# Patient Record
Sex: Male | Born: 1985 | Race: Black or African American | Hispanic: No | Marital: Single | Smoking: Current every day smoker
Health system: Southern US, Community
[De-identification: ages and names within clinical notes are randomized; demographics above are authoritative.]

---

## 2011-06-19 ENCOUNTER — Emergency Department (HOSPITAL_BASED_OUTPATIENT_CLINIC_OR_DEPARTMENT_OTHER)
Admission: EM | Admit: 2011-06-19 | Discharge: 2011-06-20 | Disposition: A | Discharge status: Home / Self Care | Payer: Self-pay | Attending: Emergency Medicine | Admitting: Emergency Medicine

## 2011-06-19 ENCOUNTER — Emergency Department (INDEPENDENT_AMBULATORY_CARE_PROVIDER_SITE_OTHER): Payer: Self-pay

## 2011-06-19 ENCOUNTER — Encounter (HOSPITAL_BASED_OUTPATIENT_CLINIC_OR_DEPARTMENT_OTHER): Payer: Self-pay | Admitting: *Deleted

## 2011-06-19 DIAGNOSIS — R1013 Epigastric pain: Secondary | ICD-10-CM | POA: Insufficient documentation

## 2011-06-19 DIAGNOSIS — K219 Gastro-esophageal reflux disease without esophagitis: Secondary | ICD-10-CM | POA: Insufficient documentation

## 2011-06-19 DIAGNOSIS — R0602 Shortness of breath: Secondary | ICD-10-CM | POA: Insufficient documentation

## 2011-06-19 DIAGNOSIS — R112 Nausea with vomiting, unspecified: Secondary | ICD-10-CM | POA: Insufficient documentation

## 2011-06-19 DIAGNOSIS — R079 Chest pain, unspecified: Secondary | ICD-10-CM

## 2011-06-19 DIAGNOSIS — E119 Type 2 diabetes mellitus without complications: Secondary | ICD-10-CM

## 2011-06-19 DIAGNOSIS — F17200 Nicotine dependence, unspecified, uncomplicated: Secondary | ICD-10-CM

## 2011-06-19 DIAGNOSIS — J45901 Unspecified asthma with (acute) exacerbation: Secondary | ICD-10-CM | POA: Insufficient documentation

## 2011-06-19 MED ORDER — ALBUTEROL SULFATE HFA 108 (90 BASE) MCG/ACT IN AERS
2.0000 | INHALATION_SPRAY | RESPIRATORY_TRACT | Status: DC | PRN
  Administered 2011-06-19: 2 via RESPIRATORY_TRACT
  Filled 2011-06-19: qty 6.7

## 2011-06-19 MED ORDER — PREDNISONE 50 MG PO TABS
60.0000 mg | ORAL_TABLET | Freq: Once | ORAL | Status: AC
  Administered 2011-06-19: 60 mg via ORAL
  Filled 2011-06-19: qty 1

## 2011-06-19 MED ORDER — FAMOTIDINE 20 MG PO TABS: 20.0000 mg | ORAL_TABLET | Freq: Two times a day (BID) | ORAL | Status: DC

## 2011-06-19 MED ORDER — PREDNISONE 20 MG PO TABS: 60.0000 mg | ORAL_TABLET | Freq: Every day | ORAL | Status: AC

## 2011-06-19 NOTE — ED Provider Notes (Signed)
History     CSN: [id number]  Arrival date & time 06/19/11  2214   First MD Initiated Contact with Patient 06/19/11 2225      Chief Complaint  Patient presents with  . Chest Pain    (Consider location/radiation/quality/duration/timing/severity/associated sxs/prior treatment) HPI The patient is a 26 yo male who presents complaining of 2 months of intermittent chest pain that lasts hours and is associated with shortness of breath and rated 5/10.  The pain encompasses the entire chest.  The patient smokes both tobacco and significant amounts of marijuana.  He thinks he may have been diagnosed with asthma as a child.  Patient denies recent cough or fever as well as any sick contacts.  He does note intermittent episodes of epigastric pain as well and vomiting that can occur every few weeks in a variety of contexts and at a variety of times of day.  There are no other associated or modifying factors.  History reviewed. No pertinent past medical history.  History reviewed. No pertinent past surgical history.  History reviewed. No pertinent family history.  History  Substance Use Topics  . Smoking status: Current Everyday Smoker -- 0.5 packs/day  . Smokeless tobacco: Not on file  . Alcohol Use: Yes      Review of Systems  Constitutional: Negative.   HENT: Negative.   Eyes: Negative.   Respiratory: Positive for shortness of breath.   Cardiovascular: Positive for chest pain.  Gastrointestinal: Positive for nausea, vomiting and abdominal pain.  Genitourinary: Negative.   Musculoskeletal: Negative.   Skin: Negative.   Neurological: Negative.   Hematological: Negative.   Psychiatric/Behavioral: Negative.   All other systems reviewed and are negative.    Allergies  Review of patient's allergies indicates no known allergies.  Home Medications   Current Outpatient Rx  Name Route Sig Dispense Refill  . IBUPROFEN 200 MG PO TABS Oral Take 400 mg by mouth every 6 (six) hours as  needed. Patient used this medication for a tooth ache.    Marland Kitchen FAMOTIDINE 20 MG PO TABS Oral Take 1 tablet (20 mg total) by mouth 2 (two) times daily. 60 tablet 0  . PREDNISONE 20 MG PO TABS Oral Take 3 tablets (60 mg total) by mouth daily. 12 tablet 0    BP 150/84  Pulse 107  Temp(Src) 98.1 F (36.7 C) (Oral)  Resp 18  SpO2 98%  Physical Exam  Nursing note and vitals reviewed. GEN: Well-developed, well-nourished male in no distress HEENT: Atraumatic, normocephalic. Oropharynx clear without erythema EYES: PERRLA BL, no scleral icterus. NECK: Trachea midline, no meningismus CV: regular rate and rhythm. No murmurs, rubs, or gallops PULM: No respiratory distress.  No crackles, wheezes, or rales. Diminished throughout. GI: soft, non-tender. No guarding, rebound, or tenderness. + bowel sounds  GU: deferred Neuro: cranial nerves 2-12 intact, no abnormalities of strength or sensation, A and O x 3 MSK: Patient moves all 4 extremities symmetrically, no deformity, edema, or injury noted Skin: No rashes petechiae, purpura, or jaundice Psych: no abnormality of mood   ED Course  Procedures (including critical care time)   Date: 06/19/2011  Rate: 100  Rhythm: normal sinus rhythm  QRS Axis: right  Intervals: normal  ST/T Wave abnormalities: nonspecific T wave changes  Conduction Disutrbances:none  Narrative Interpretation:   Old EKG Reviewed: none available   Labs Reviewed - No data to display Dg Chest 2 View  06/19/2011  *RADIOLOGY REPORT*  Clinical Data: Mid chest pain for 6-8 months; history of  smoking and diabetes.  CHEST - 2 VIEW  Comparison: None.  Findings: The lungs are well-aerated and clear.  There is no evidence of focal opacification, pleural effusion or pneumothorax.  The heart is normal in size; the mediastinal contour is within normal limits.  No acute osseous abnormalities are seen.  IMPRESSION: No acute cardiopulmonary process seen.  Original Report Authenticated By:  Tonia Ghent, M.D.     1. Chest pain   2. Asthma exacerbation   3. GERD (gastroesophageal reflux disease)       MDM  The patient was evaluated for his complaints with CXR and ECG.  The patient had no signs of pneumonia.  There were no signs of acute ischemia on ECG.  The patient was given albuterol and had significant improvement on reassessment.  He was treated here with prednisone as well.  He was discharged with both.  Also, given his description of his abdominal pain and vomiting he was given a prescription for pepcid.  Patient will follow-up regarding these issues with a PCP.  Patient was also counseled on smoking cessation.  He was discharged in good condition.        Cyndra Numbers, MD 06/20/11 1344

## 2011-06-19 NOTE — ED Notes (Signed)
Dr. Hunt at bedside.

## 2011-06-19 NOTE — ED Notes (Signed)
Pt ambulatory to XR

## 2011-06-19 NOTE — ED Notes (Signed)
Chest pain on an off x 2 months. Uses a lot of marijuana per pt.

## 2011-06-19 NOTE — Discharge Instructions (Signed)
Asthma, Adult Asthma is caused by narrowing of the air passages in the lungs. It may be triggered by pollen, dust, animal dander, molds, some foods, respiratory infections, exposure to smoke, exercise, emotional stress or other allergens (things that cause allergic reactions or allergies). Repeat attacks are common. HOME CARE INSTRUCTIONS   Use prescription medications as ordered by your caregiver.   Avoid pollen, dust, animal dander, molds, smoke and other things that cause attacks at home and at work.   You may have fewer attacks if you decrease dust in your home. Electrostatic air cleaners may help.   It may help to replace your pillows or mattress with materials less likely to cause allergies.   Talk to your caregiver about an action plan for managing asthma attacks at home, including, the use of a peak flow meter which measures the severity of your asthma attack. An action plan can help minimize or stop the attack without having to seek medical care.   If you are not on a fluid restriction, drink 8 to 10 glasses of water each day.   Always have a plan prepared for seeking medical attention, including, calling your physician, accessing local emergency care, and calling 911 (in the [place] for a severe attack.   Discuss possible exercise routines with your caregiver.   If animal dander is the cause of asthma, you may need to get rid of pets.  SEEK MEDICAL CARE IF:   You have wheezing and shortness of breath even if taking medicine to prevent attacks.   You have muscle aches, chest pain or thickening of sputum.   Your sputum changes from clear or white to yellow, green, gray, or bloody.   You have any problems that may be related to the medicine you are taking (such as a rash, itching, swelling or trouble breathing).  SEEK IMMEDIATE MEDICAL CARE IF:   Your usual medicines do not stop your wheezing or there is increased coughing and/or shortness of breath.   You have increased  difficulty breathing.   You have a fever.  MAKE SURE YOU:   Understand these instructions.   Will watch your condition.   Will get help right away if you are not doing well or get worse.  Document Released: 02/24/2005 Document Revised: 02/13/2011 Document Reviewed: 10/13/2007 [facility] Patient Information 2012 [place], [place].Chest Pain (Nonspecific) It is often hard to give a specific diagnosis for the cause of chest pain. There is always a chance that your pain could be related to something serious, such as a heart attack or a blood clot in the lungs. You need to follow up with your caregiver for further evaluation. CAUSES   Heartburn.   Pneumonia or bronchitis.   Anxiety or stress.   Inflammation around your heart (pericarditis) or lung (pleuritis or pleurisy).   A blood clot in the lung.   A collapsed lung (pneumothorax). It can develop suddenly on its own (spontaneous pneumothorax) or from injury (trauma) to the chest.   Shingles infection (herpes zoster virus).  The chest wall is composed of bones, muscles, and cartilage. Any of these can be the source of the pain.  The bones can be bruised by injury.   The muscles or cartilage can be strained by coughing or overwork.   The cartilage can be affected by inflammation and become sore (costochondritis).  DIAGNOSIS  Lab tests or other studies, such as X-rays, electrocardiography, stress testing, or cardiac imaging, may be needed to find the cause of your pain.  TREATMENT   Treatment depends on what may be causing your chest pain. Treatment may include:   Acid blockers for heartburn.   Anti-inflammatory medicine.   Pain medicine for inflammatory conditions.   Antibiotics if an infection is present.   You may be advised to change lifestyle habits. This includes stopping smoking and avoiding alcohol, caffeine, and chocolate.   You may be advised to keep your head raised (elevated) when sleeping. This reduces the chance  of acid going backward from your stomach into your esophagus.   Most of the time, nonspecific chest pain will improve within 2 to 3 days with rest and mild pain medicine.  HOME CARE INSTRUCTIONS   If antibiotics were prescribed, take your antibiotics as directed. Finish them even if you start to feel better.   For the next few days, avoid physical activities that bring on chest pain. Continue physical activities as directed.   Do not smoke.   Avoid drinking alcohol.   Only take over-the-counter or prescription medicine for pain, discomfort, or fever as directed by your caregiver.   Follow your caregiver's suggestions for further testing if your chest pain does not go away.   Keep any follow-up appointments you made. If you do not go to an appointment, you could develop lasting (chronic) problems with pain. If there is any problem keeping an appointment, you must call to reschedule.  SEEK MEDICAL CARE IF:   You think you are having problems from the medicine you are taking. Read your medicine instructions carefully.   Your chest pain does not go away, even after treatment.   You develop a rash with blisters on your chest.  SEEK IMMEDIATE MEDICAL CARE IF:   You have increased chest pain or pain that spreads to your arm, neck, jaw, back, or abdomen.   You develop shortness of breath, an increasing cough, or you are coughing up blood.   You have severe back or abdominal pain, feel nauseous, or vomit.   You develop severe weakness, fainting, or chills.   You have a fever.  THIS IS AN EMERGENCY. Do not wait to see if the pain will go away. Get medical help at once. Call your local emergency services (911 in [place] Do not drive yourself to the hospital. MAKE SURE YOU:   Understand these instructions.   Will watch your condition.   Will get help right away if you are not doing well or get worse.  Document Released: 12/04/2004 Document Revised: 02/13/2011 Document Reviewed:  09/30/2007 [facility] Patient Information 2012 [place], [place].Gastroesophageal Reflux Disease, Adult Gastroesophageal reflux disease (GERD) happens when acid from your stomach flows up into the esophagus. When acid comes in contact with the esophagus, the acid causes soreness (inflammation) in the esophagus. Over time, GERD may create small holes (ulcers) in the lining of the esophagus. CAUSES   Increased body weight. This puts pressure on the stomach, making acid rise from the stomach into the esophagus.   Smoking. This increases acid production in the stomach.   Drinking alcohol. This causes decreased pressure in the lower esophageal sphincter (valve or ring of muscle between the esophagus and stomach), allowing acid from the stomach into the esophagus.   Late evening meals and a full stomach. This increases pressure and acid production in the stomach.   A malformed lower esophageal sphincter.  Sometimes, no cause is found. SYMPTOMS   Burning pain in the lower part of the mid-chest behind the breastbone and in the mid-stomach area. This may  occur twice a week or more often.   Trouble swallowing.   Sore throat.   Dry cough.   Asthma-like symptoms including chest tightness, shortness of breath, or wheezing.  DIAGNOSIS  Your caregiver may be able to diagnose GERD based on your symptoms. In some cases, X-rays and other tests may be done to check for complications or to check the condition of your stomach and esophagus. TREATMENT  Your caregiver may recommend over-the-counter or prescription medicines to help decrease acid production. Ask your caregiver before starting or adding any new medicines.  HOME CARE INSTRUCTIONS   Change the factors that you can control. Ask your caregiver for guidance concerning weight loss, quitting smoking, and alcohol consumption.   Avoid foods and drinks that make your symptoms worse, such as:   Caffeine or alcoholic drinks.   Chocolate.   Peppermint  or mint flavorings.   Garlic and onions.   Spicy foods.   Citrus fruits, such as oranges, lemons, or limes.   Tomato-based foods such as sauce, chili, salsa, and pizza.   Fried and fatty foods.   Avoid lying down for the 3 hours prior to your bedtime or prior to taking a nap.   Eat small, frequent meals instead of large meals.   Wear loose-fitting clothing. Do not wear anything tight around your waist that causes pressure on your stomach.   Raise the head of your bed 6 to 8 inches with wood blocks to help you sleep. Extra pillows will not help.   Only take over-the-counter or prescription medicines for pain, discomfort, or fever as directed by your caregiver.   Do not take aspirin, ibuprofen, or other nonsteroidal anti-inflammatory drugs (NSAIDs).  SEEK IMMEDIATE MEDICAL CARE IF:   You have pain in your arms, neck, jaw, teeth, or back.   Your pain increases or changes in intensity or duration.   You develop nausea, vomiting, or sweating (diaphoresis).   You develop shortness of breath, or you faint.   Your vomit is green, yellow, black, or looks like coffee grounds or blood.   Your stool is red, bloody, or black.  These symptoms could be signs of other problems, such as heart disease, gastric bleeding, or esophageal bleeding. MAKE SURE YOU:   Understand these instructions.   Will watch your condition.   Will get help right away if you are not doing well or get worse.  Document Released: 12/04/2004 Document Revised: 02/13/2011 Document Reviewed: 09/13/2010 [facility] Patient Information 2012 [place], [place].

## 2011-06-19 NOTE — ED Notes (Signed)
Pt reports intermittent anterior left sided chest pains x2 months. Episodes will last approx 5-76min, then subside. Worsens with deep breathing. Mild SOB with the episodes. Pt admits to daily marijuana, but denies crack cocaine or other drugs. Denies CP at this time. States he was encouraged to come to ER d/t continuing pain.

## 2011-07-24 ENCOUNTER — Encounter (HOSPITAL_BASED_OUTPATIENT_CLINIC_OR_DEPARTMENT_OTHER): Payer: Self-pay

## 2011-07-24 ENCOUNTER — Emergency Department (HOSPITAL_BASED_OUTPATIENT_CLINIC_OR_DEPARTMENT_OTHER)
Admission: EM | Admit: 2011-07-24 | Discharge: 2011-07-24 | Disposition: A | Discharge status: Home / Self Care | Payer: Self-pay | Attending: Emergency Medicine | Admitting: Emergency Medicine

## 2011-07-24 DIAGNOSIS — J029 Acute pharyngitis, unspecified: Secondary | ICD-10-CM | POA: Insufficient documentation

## 2011-07-24 DIAGNOSIS — K0381 Cracked tooth: Secondary | ICD-10-CM | POA: Insufficient documentation

## 2011-07-24 DIAGNOSIS — J45909 Unspecified asthma, uncomplicated: Secondary | ICD-10-CM | POA: Insufficient documentation

## 2011-07-24 DIAGNOSIS — K089 Disorder of teeth and supporting structures, unspecified: Secondary | ICD-10-CM | POA: Insufficient documentation

## 2011-07-24 DIAGNOSIS — F17200 Nicotine dependence, unspecified, uncomplicated: Secondary | ICD-10-CM | POA: Insufficient documentation

## 2011-07-24 LAB — RAPID STREP SCREEN (MED CTR MEBANE ONLY): Streptococcus, Group A Screen (Direct): NEGATIVE

## 2011-07-24 MED ORDER — NAPROXEN 500 MG PO TABS: 500.0000 mg | ORAL_TABLET | Freq: Two times a day (BID) | ORAL | Status: AC

## 2011-07-24 NOTE — Discharge Instructions (Signed)
Pharyngitis, Viral and Bacterial Pharyngitis is soreness (inflammation) or infection of the pharynx. It is also called a sore throat. CAUSES  Most sore throats are caused by viruses and are part of a cold. However, some sore throats are caused by strep and other bacteria. Sore throats can also be caused by post nasal drip from draining sinuses, allergies and sometimes from sleeping with an open mouth. Infectious sore throats can be spread from person to person by coughing, sneezing and sharing cups or eating utensils. TREATMENT  Sore throats that are viral usually last 3-4 days. Viral illness will get better without medications (antibiotics). Strep throat and other bacterial infections will usually begin to get better about 24-48 hours after you begin to take antibiotics. HOME CARE INSTRUCTIONS   If the caregiver feels there is a bacterial infection or if there is a positive strep test, they will prescribe an antibiotic. The full course of antibiotics must be taken. If the full course of antibiotic is not taken, you or your child may become ill again. If you or your child has strep throat and do not finish all of the medication, serious heart or kidney diseases may develop.   Drink enough water and fluids to keep your urine clear or pale yellow.   Only take over-the-counter or prescription medicines for pain, discomfort or fever as directed by your caregiver.   Get lots of rest.   Gargle with salt water ( tsp. of salt in a glass of water) as often as every 1-2 hours as you need for comfort.   Hard candies may soothe the throat if individual is not at risk for choking. Throat sprays or lozenges may also be used.  SEEK MEDICAL CARE IF:   Large, tender lumps in the neck develop.   A rash develops.   Green, yellow-brown or bloody sputum is coughed up.   Your baby is older than 3 months with a rectal temperature of 100.5 F (38.1 C) or higher for more than 1 day.  SEEK IMMEDIATE MEDICAL CARE  IF:   A stiff neck develops.   You or your child are drooling or unable to swallow liquids.   You or your child are vomiting, unable to keep medications or liquids down.   You or your child has severe pain, unrelieved with recommended medications.   You or your child are having difficulty breathing (not due to stuffy nose).   You or your child are unable to fully open your mouth.   You or your child develop redness, swelling, or severe pain anywhere on the neck.   You have a fever.   Your baby is older than 3 months with a rectal temperature of 102 F (38.9 C) or higher.   Your baby is 3 months old or younger with a rectal temperature of 100.4 F (38 C) or higher.  MAKE SURE YOU:   Understand these instructions.   Will watch your condition.   Will get help right away if you are not doing well or get worse.  Document Released: 02/24/2005 Document Revised: 02/13/2011 Document Reviewed: 05/24/2007 [employer] Patient Information 2012 [employer].   RESOURCE GUIDE  Dental Problems  Patients with Medicaid:  [facility]                     [address]                                             Phone:  [phone]                                                  If unable to pay or uninsured, contact:  [facility] or [facility] to become qualified for the adult dental clinic.  Chronic Pain Problems Contact [facility]  [phone] Patients need to be referred by their primary care doctor.  Insufficient Money for Medicine Contact [employer]:  call "211" or [facility] 271-5999.  No Primary Care Doctor Call [facility]  832-8000 Other agencies that provide inexpensive medical care    [facility]  832-8035     Internal Medicine  [phone]    [facility]  [phone]    Women[facility] Clinic  [phone]    [facility]  [phone]    [facility]   [phone]  Substance Abuse Resources [facility]  336-882-2125 [facility] 336-784-9470 [facility] 336-285-9073 [facility] 336-845-3988 Residential & Outpatient Substance Abuse Program  800-659-3381  Psychological Services [facility]  832-9600 [facility]  [phone] [facility]   800 853-5163 (emergency services 641-4993)  Abuse/Neglect [place] Child Abuse Hotline (336) 641-3795 [place] [facility] Abuse Hotline 800-378-5315 (After Hours)  Emergency Shelter [facility] (336) 271-5985  [facility] (336) 275-9566 [facility] (704) 372-4663  MRSA Hotline #:   832-7006    [place] Resources  [facility] [place]  [employer]                           [facility] [address] [place]                     [address]         [address]  [place]                                               [place]                              [place] Phone:  [phone]                                  Phone:  [phone]                   Phone:  [phone]  [facility] Phone:  [phone]  [place] Child Abuse Hotline (336) 342-1394 (336) 342-3537 (After Hours) 

## 2011-07-24 NOTE — ED Provider Notes (Signed)
History     CSN: [id number]  Arrival date & time 07/24/11  2016   First MD Initiated Contact with Patient 07/24/11 2106      Chief Complaint  Patient presents with  . Dental Pain  . Sore Throat    (Consider location/radiation/quality/duration/timing/severity/associated sxs/prior treatment) HPI Comments: Patient presents with 2 complaints today.  His first complaint is a broken left lower molar for the last one to 2 years.  He states the pain has been increasing over the last few days.  He has no fevers and no jaw swelling.  He secondarily noted that he's had a sore throat since this morning.  It hurts to swallow.  He has not tried any medications for it.  He does not have a dentist.  Patient is a 26 y.o. male presenting with tooth pain and pharyngitis. The history is provided by the patient. No language interpreter was used.  Dental PainThe primary symptoms include mouth pain. Primary symptoms do not include headaches, fever, shortness of breath or cough.   Sore Throat Pertinent negatives include no chest pain, no abdominal pain, no headaches and no shortness of breath.    Past Medical History  Diagnosis Date  . Asthma     History reviewed. No pertinent past surgical history.  No family history on file.  History  Substance Use Topics  . Smoking status: Current Everyday Smoker -- 0.5 packs/day  . Smokeless tobacco: Not on file  . Alcohol Use: Yes      Review of Systems  Constitutional: Negative.  Negative for fever and chills.  Eyes: Negative.  Negative for discharge and redness.  Respiratory: Negative.  Negative for cough and shortness of breath.   Cardiovascular: Negative.  Negative for chest pain.  Gastrointestinal: Negative.  Negative for nausea, vomiting and abdominal pain.  Genitourinary: Negative.  Negative for hematuria.  Musculoskeletal: Negative.  Negative for back pain.  Skin: Negative.  Negative for color change and rash.  Neurological: Negative for  syncope and headaches.  Hematological: Negative.  Negative for adenopathy.  Psychiatric/Behavioral: Negative.  Negative for confusion.  All other systems reviewed and are negative.    Allergies  Review of patient's allergies indicates no known allergies.  Home Medications   Current Outpatient Rx  Name Route Sig Dispense Refill  . IBUPROFEN 200 MG PO TABS Oral Take 400 mg by mouth every 6 (six) hours as needed. Patient used this medication for a tooth ache.      BP 167/93  Pulse 77  Temp(Src) 98.8 F (37.1 C) (Oral)  Resp 18  Ht 6' (1.829 m)  Wt 180 lb (81.647 kg)  BMI 24.41 kg/m2  SpO2 98%  Physical Exam  Nursing note and vitals reviewed. Constitutional: He is oriented to person, place, and time. He appears well-developed and well-nourished.  Non-toxic appearance. He does not have a sickly appearance.  HENT:  Head: Normocephalic and atraumatic.  Mouth/Throat:         Bilateral tonsillar swelling, erythema and exudates on the left tonsil.  Uvula is midline.  Eyes: Conjunctivae, EOM and lids are normal. Pupils are equal, round, and reactive to light.  Neck: Trachea normal, normal range of motion and full passive range of motion without pain. Neck supple.  Cardiovascular: Normal rate, regular rhythm and normal heart sounds.   Pulmonary/Chest: Effort normal and breath sounds normal. No respiratory distress.  Abdominal: Soft. Normal appearance. He exhibits no distension. There is no tenderness. There is no rebound and no CVA tenderness.  Musculoskeletal: Normal range of motion.  Lymphadenopathy:    He has no cervical adenopathy.  Neurological: He is alert and oriented to person, place, and time. He has normal strength.  Skin: Skin is warm, dry and intact. No rash noted.  Psychiatric: He has a normal mood and affect. His behavior is normal. Judgment and thought content normal.    ED Course  Procedures (including critical care time)  Results for orders placed during the  hospital encounter of 07/24/11  RAPID STREP SCREEN      Component Value Range   Streptococcus, Group A Screen (Direct) NEGATIVE  NEGATIVE        MDM  Patient with likely viral pharyngitis given his exam and a negative strep test at this time.  Regarding his tooth is a chronic issue and I will refer him to our dental clinic resources.        Nat Christen, MD 07/24/11 [phone]

## 2011-07-24 NOTE — ED Notes (Signed)
Left lower tooth ache x 1-2 years-sore throat started today

## 2012-04-02 ENCOUNTER — Encounter (HOSPITAL_BASED_OUTPATIENT_CLINIC_OR_DEPARTMENT_OTHER): Payer: Self-pay

## 2012-04-02 ENCOUNTER — Emergency Department (HOSPITAL_BASED_OUTPATIENT_CLINIC_OR_DEPARTMENT_OTHER)
Admission: EM | Admit: 2012-04-02 | Discharge: 2012-04-02 | Disposition: A | Discharge status: Home / Self Care | Payer: Self-pay | Attending: Emergency Medicine | Admitting: Emergency Medicine

## 2012-04-02 DIAGNOSIS — F17200 Nicotine dependence, unspecified, uncomplicated: Secondary | ICD-10-CM | POA: Insufficient documentation

## 2012-04-02 DIAGNOSIS — J45909 Unspecified asthma, uncomplicated: Secondary | ICD-10-CM | POA: Insufficient documentation

## 2012-04-02 DIAGNOSIS — R300 Dysuria: Secondary | ICD-10-CM | POA: Insufficient documentation

## 2012-04-02 LAB — URINALYSIS, ROUTINE W REFLEX MICROSCOPIC
Glucose, UA: NEGATIVE mg/dL
Ketones, ur: NEGATIVE mg/dL
Leukocytes, UA: NEGATIVE
Nitrite: NEGATIVE
pH: 6 (ref 5.0–8.0)

## 2012-04-02 MED ORDER — CEFTRIAXONE SODIUM 250 MG IJ SOLR
250.0000 mg | INTRAMUSCULAR | Status: DC
  Administered 2012-04-02: 250 mg via INTRAMUSCULAR
  Filled 2012-04-02: qty 250

## 2012-04-02 MED ORDER — AZITHROMYCIN 250 MG PO TABS
1000.0000 mg | ORAL_TABLET | Freq: Once | ORAL | Status: AC
Start: 2012-04-02 — End: 2012-04-02
  Administered 2012-04-02: 1000 mg via ORAL
  Filled 2012-04-02: qty 4

## 2012-04-02 NOTE — ED Provider Notes (Signed)
History     CSN: [id number]  Arrival date & time 04/02/12  1304   First MD Initiated Contact with Patient 04/02/12 1327      Chief Complaint  Patient presents with  . Dysuria    HPI Patient has had mild discomfort with urination for the last 2 days. He has the sense that he needs to urinate but then does not urinate a significant amount. He has not had any discharge. He denies any fever or abdominal pain or other complaints. His girlfriend is here as well for treatment for urinary symptoms. Past Medical History  Diagnosis Date  . Asthma     History reviewed. No pertinent past surgical history.  No family history on file.  History  Substance Use Topics  . Smoking status: Current Every Day Smoker -- 0.5 packs/day  . Smokeless tobacco: Not on file  . Alcohol Use: Yes      Review of Systems  Constitutional: Negative for fever.  Gastrointestinal: Negative for abdominal pain.  Genitourinary: Negative for discharge, scrotal swelling and genital sores.    Allergies  Review of patient's allergies indicates no known allergies.  Home Medications   Current Outpatient Rx  Name  Route  Sig  Dispense  Refill  . IBUPROFEN 200 MG PO TABS   Oral   Take 400 mg by mouth every 6 (six) hours as needed. Patient used this medication for a tooth ache.         Marland Kitchen NAPROXEN 500 MG PO TABS   Oral   Take 1 tablet (500 mg total) by mouth 2 (two) times daily.   30 tablet   0     BP 166/88  Pulse 94  Temp 98.1 F (36.7 C) (Oral)  Resp 14  Ht 6' (1.829 m)  Wt 180 lb (81.647 kg)  BMI 24.41 kg/m2  SpO2 100%  Physical Exam  Nursing note and vitals reviewed. Constitutional: He appears well-developed and well-nourished. No distress.  HENT:  Head: Normocephalic and atraumatic.  Right Ear: External ear normal.  Left Ear: External ear normal.  Eyes: Conjunctivae normal are normal. Right eye exhibits no discharge. Left eye exhibits no discharge. No scleral icterus.  Neck: Neck  supple. No tracheal deviation present.  Cardiovascular: Normal rate.   Pulmonary/Chest: Effort normal. No stridor. No respiratory distress.  Genitourinary: Penis normal. No penile tenderness.       No penile discharge  Musculoskeletal: He exhibits no edema.  Neurological: He is alert. Cranial nerve deficit: no gross deficits.  Skin: Skin is warm and dry. No rash noted.  Psychiatric: He has a normal mood and affect.    ED Course  Procedures (including critical care time)   Labs Reviewed  URINALYSIS, ROUTINE W REFLEX MICROSCOPIC  GC/CHLAMYDIA PROBE AMP   No results found.   1. Dysuria       MDM  Patient was tested for chlamydia and gonorrhea. He opted for empiric treatment for possible STD.        Celene Kras, MD 04/02/12 [phone]

## 2012-04-02 NOTE — ED Notes (Signed)
C/o dysuria x 2 days-denies penile d/c-concerned that girl friend has same s/s

## 2012-04-03 LAB — GC/CHLAMYDIA PROBE AMP: CT Probe RNA: NEGATIVE

## 2013-05-01 ENCOUNTER — Emergency Department (HOSPITAL_BASED_OUTPATIENT_CLINIC_OR_DEPARTMENT_OTHER)
Admission: EM | Admit: 2013-05-01 | Discharge: 2013-05-01 | Disposition: A | Discharge status: Home / Self Care | Payer: Self-pay | Attending: Emergency Medicine | Admitting: Emergency Medicine

## 2013-05-01 ENCOUNTER — Encounter (HOSPITAL_BASED_OUTPATIENT_CLINIC_OR_DEPARTMENT_OTHER): Payer: Self-pay | Admitting: Emergency Medicine

## 2013-05-01 DIAGNOSIS — F17200 Nicotine dependence, unspecified, uncomplicated: Secondary | ICD-10-CM | POA: Insufficient documentation

## 2013-05-01 DIAGNOSIS — K089 Disorder of teeth and supporting structures, unspecified: Secondary | ICD-10-CM | POA: Insufficient documentation

## 2013-05-01 DIAGNOSIS — K0889 Other specified disorders of teeth and supporting structures: Secondary | ICD-10-CM

## 2013-05-01 DIAGNOSIS — J45909 Unspecified asthma, uncomplicated: Secondary | ICD-10-CM | POA: Insufficient documentation

## 2013-05-01 MED ORDER — AMOXICILLIN 500 MG PO CAPS: 500.0000 mg | ORAL_CAPSULE | Freq: Three times a day (TID) | ORAL | Status: AC

## 2013-05-01 MED ORDER — IBUPROFEN 800 MG PO TABS: 800.0000 mg | ORAL_TABLET | Freq: Three times a day (TID) | ORAL | Status: AC

## 2013-05-01 NOTE — ED Provider Notes (Signed)
CSN: [id number]     Arrival date & time 05/01/13  1722 History   First MD Initiated Contact with Patient 05/01/13 1821     Chief Complaint  Patient presents with  . Dental Pain     (Consider location/radiation/quality/duration/timing/severity/associated sxs/prior Treatment) HPI Comments: Patient here with dental pain to left upper 2nd molar - states that he knows he has a whole in the tooth where a previous filling was.  He states no fever, chills, reports some mild swelling to the area but is able to open mouth, swallow and denies sore throat.  The history is provided by the patient. No language interpreter was used.    Past Medical History  Diagnosis Date  . Asthma    History reviewed. No pertinent past surgical history. No family history on file. History  Substance Use Topics  . Smoking status: Current Every Day Smoker -- 0.50 packs/day  . Smokeless tobacco: Not on file  . Alcohol Use: Yes    Review of Systems  All other systems reviewed and are negative.      Allergies  Review of patient's allergies indicates no known allergies.  Home Medications   Current Outpatient Rx  Name  Route  Sig  Dispense  Refill  . amoxicillin (AMOXIL) 500 MG capsule   Oral   Take 1 capsule (500 mg total) by mouth 3 (three) times daily.   21 capsule   0   . ibuprofen (ADVIL,MOTRIN) 200 MG tablet   Oral   Take 400 mg by mouth every 6 (six) hours as needed. Patient used this medication for a tooth ache.         . ibuprofen (ADVIL,MOTRIN) 800 MG tablet   Oral   Take 1 tablet (800 mg total) by mouth 3 (three) times daily.   21 tablet   0    BP 162/94  Pulse 92  Temp(Src) 98.7 F (37.1 C) (Oral)  Resp 18  SpO2 100% Physical Exam  Nursing note and vitals reviewed. Constitutional: He is oriented to person, place, and time. He appears well-developed and well-nourished. No distress.  HENT:  Head: Normocephalic and atraumatic.  Right Ear: External ear normal.  Left Ear:  External ear normal.  Nose: Nose normal.  Mouth/Throat: Oropharynx is clear and moist. No trismus in the jaw. No dental abscesses or dental caries. No oropharyngeal exudate.    Eyes: Conjunctivae are normal. Pupils are equal, round, and reactive to light. No scleral icterus.  Neck: Normal range of motion.  Pulmonary/Chest: Effort normal.  Musculoskeletal: Normal range of motion. He exhibits no edema and no tenderness.  Lymphadenopathy:    He has no cervical adenopathy.  Neurological: He is alert and oriented to person, place, and time. He exhibits normal muscle tone. Coordination normal.  Skin: Skin is warm and dry. No rash noted. No erythema. No pallor.  Psychiatric: He has a normal mood and affect. His behavior is normal. Judgment and thought content normal.    ED Course  Procedures (including critical care time) Labs Review Labs Reviewed - No data to display Imaging Review No results found.  EKG Interpretation   None       MDM   Final diagnoses:  Pain, dental    Patient here with dental pain - possible abscess early, will start on ibuprofen and antibiotics for this.    Izola PriceFrances C. Marisue HumbleSanford, PA-C 05/02/13 0006

## 2013-05-01 NOTE — Discharge Instructions (Signed)
Dental Care and Dentist Visits °Dental care supports good overall health. Regular dental visits can also help you avoid dental pain, bleeding, infection, and other more serious health problems in the future. It is important to keep the mouth healthy because diseases in the teeth, gums, and other oral tissues can spread to other areas of the body. Some problems, such as diabetes, heart disease, and pre-term labor have been associated with poor oral health.  °See your dentist every 6 months. If you experience emergency problems such as a toothache or broken tooth, go to the dentist right away. If you see your dentist regularly, you may catch problems early. It is easier to be treated for problems in the early stages.  °WHAT TO EXPECT AT A DENTIST VISIT  °Your dentist will look for many common oral health problems and recommend proper treatment. At your regular dental visit, you can expect: °· Gentle cleaning of the teeth and gums. This includes scraping and polishing. This helps to remove the sticky substance around the teeth and gums (plaque). Plaque forms in the mouth shortly after eating. Over time, plaque hardens on the teeth as tartar. If tartar is not removed regularly, it can cause problems. Cleaning also helps remove stains. °· Periodic X-rays. These pictures of the teeth and supporting bone will help your dentist assess the health of your teeth. °· Periodic fluoride treatments. Fluoride is a natural mineral shown to help strengthen teeth. Fluoride treatment involves applying a fluoride gel or varnish to the teeth. It is most commonly done in children. °· Examination of the mouth, tongue, jaws, teeth, and gums to look for any oral health problems, such as: °· Cavities (dental caries). This is decay on the tooth caused by plaque, sugar, and acid in the mouth. It is best to catch a cavity when it is small. °· Inflammation of the gums caused by plaque buildup (gingivitis). °· Problems with the mouth or malformed  or misaligned teeth. °· Oral cancer or other diseases of the soft tissues or jaws.  °KEEP YOUR TEETH AND GUMS HEALTHY °For healthy teeth and gums, follow these general guidelines as well as your dentist's specific advice: °· Have your teeth professionally cleaned at the dentist every 6 months. °· Brush twice daily with a fluoride toothpaste. °· Floss your teeth daily.  °· Ask your dentist if you need fluoride supplements, treatments, or fluoride toothpaste. °· Eat a healthy diet. Reduce foods and drinks with added sugar. °· Avoid smoking. °TREATMENT FOR ORAL HEALTH PROBLEMS °If you have oral health problems, treatment varies depending on the conditions present in your teeth and gums. °· Your caregiver will most likely recommend good oral hygiene at each visit. °· For cavities, gingivitis, or other oral health disease, your caregiver will perform a procedure to treat the problem. This is typically done at a separate appointment. Sometimes your caregiver will refer you to another dental specialist for specific tooth problems or for surgery. °SEEK IMMEDIATE DENTAL CARE IF: °· You have pain, bleeding, or soreness in the gum, tooth, jaw, or mouth area. °· A permanent tooth becomes loose or separated from the gum socket. °· You experience a blow or injury to the mouth or jaw area. °Document Released: 11/06/2010 Document Revised: 05/19/2011 Document Reviewed: 11/06/2010 °ExitCare® Patient Information ©2014 [employer]. ° °Dental Pain °A tooth ache may be caused by cavities (tooth decay). Cavities expose the nerve of the tooth to air and hot or cold temperatures. It may come from an infection or abscess (also called a   boil or furuncle) around your tooth. It is also often caused by dental caries (tooth decay). This causes the pain you are having. °DIAGNOSIS  °Your caregiver can diagnose this problem by exam. °TREATMENT  °· If caused by an infection, it may be treated with medications which kill germs (antibiotics) and pain  medications as prescribed by your caregiver. Take medications as directed. °· Only take over-the-counter or prescription medicines for pain, discomfort, or fever as directed by your caregiver. °· Whether the tooth ache today is caused by infection or dental disease, you should see your dentist as soon as possible for further care. °SEEK MEDICAL CARE IF: °The exam and treatment you received today has been provided on an emergency basis only. This is not a substitute for complete medical or dental care. If your problem worsens or new problems (symptoms) appear, and you are unable to meet with your dentist, call or return to this location. °SEEK IMMEDIATE MEDICAL CARE IF:  °· You have a fever. °· You develop redness and swelling of your face, jaw, or neck. °· You are unable to open your mouth. °· You have severe pain uncontrolled by pain medicine. °MAKE SURE YOU:  °· Understand these instructions. °· Will watch your condition. °· Will get help right away if you are not doing well or get worse. °Document Released: 02/24/2005 Document Revised: 05/19/2011 Document Reviewed: 10/13/2007 °ExitCare® Patient Information ©2014 [employer]. ° °

## 2013-05-01 NOTE — ED Notes (Signed)
Patient having dental pain left upper side for the past 2-3 weeks.

## 2013-05-06 NOTE — ED Provider Notes (Signed)
History/physical exam/procedure(s) were performed by non-physician practitioner and as supervising physician I was immediately available for consultation/collaboration. I have reviewed all notes and am in agreement with care and plan.   Hilario Quarryanielle S Raheen Capili, MD 05/06/13 [phone]

## 2013-05-30 IMAGING — CR DG CHEST 2V
2 series · 2 of 2 positions shown · non-contrast
Comparison: None.

CLINICAL DATA: Mid chest pain for 6-8 months; history of smoking
and diabetes.

CHEST - 2 VIEW

[w chest pa]
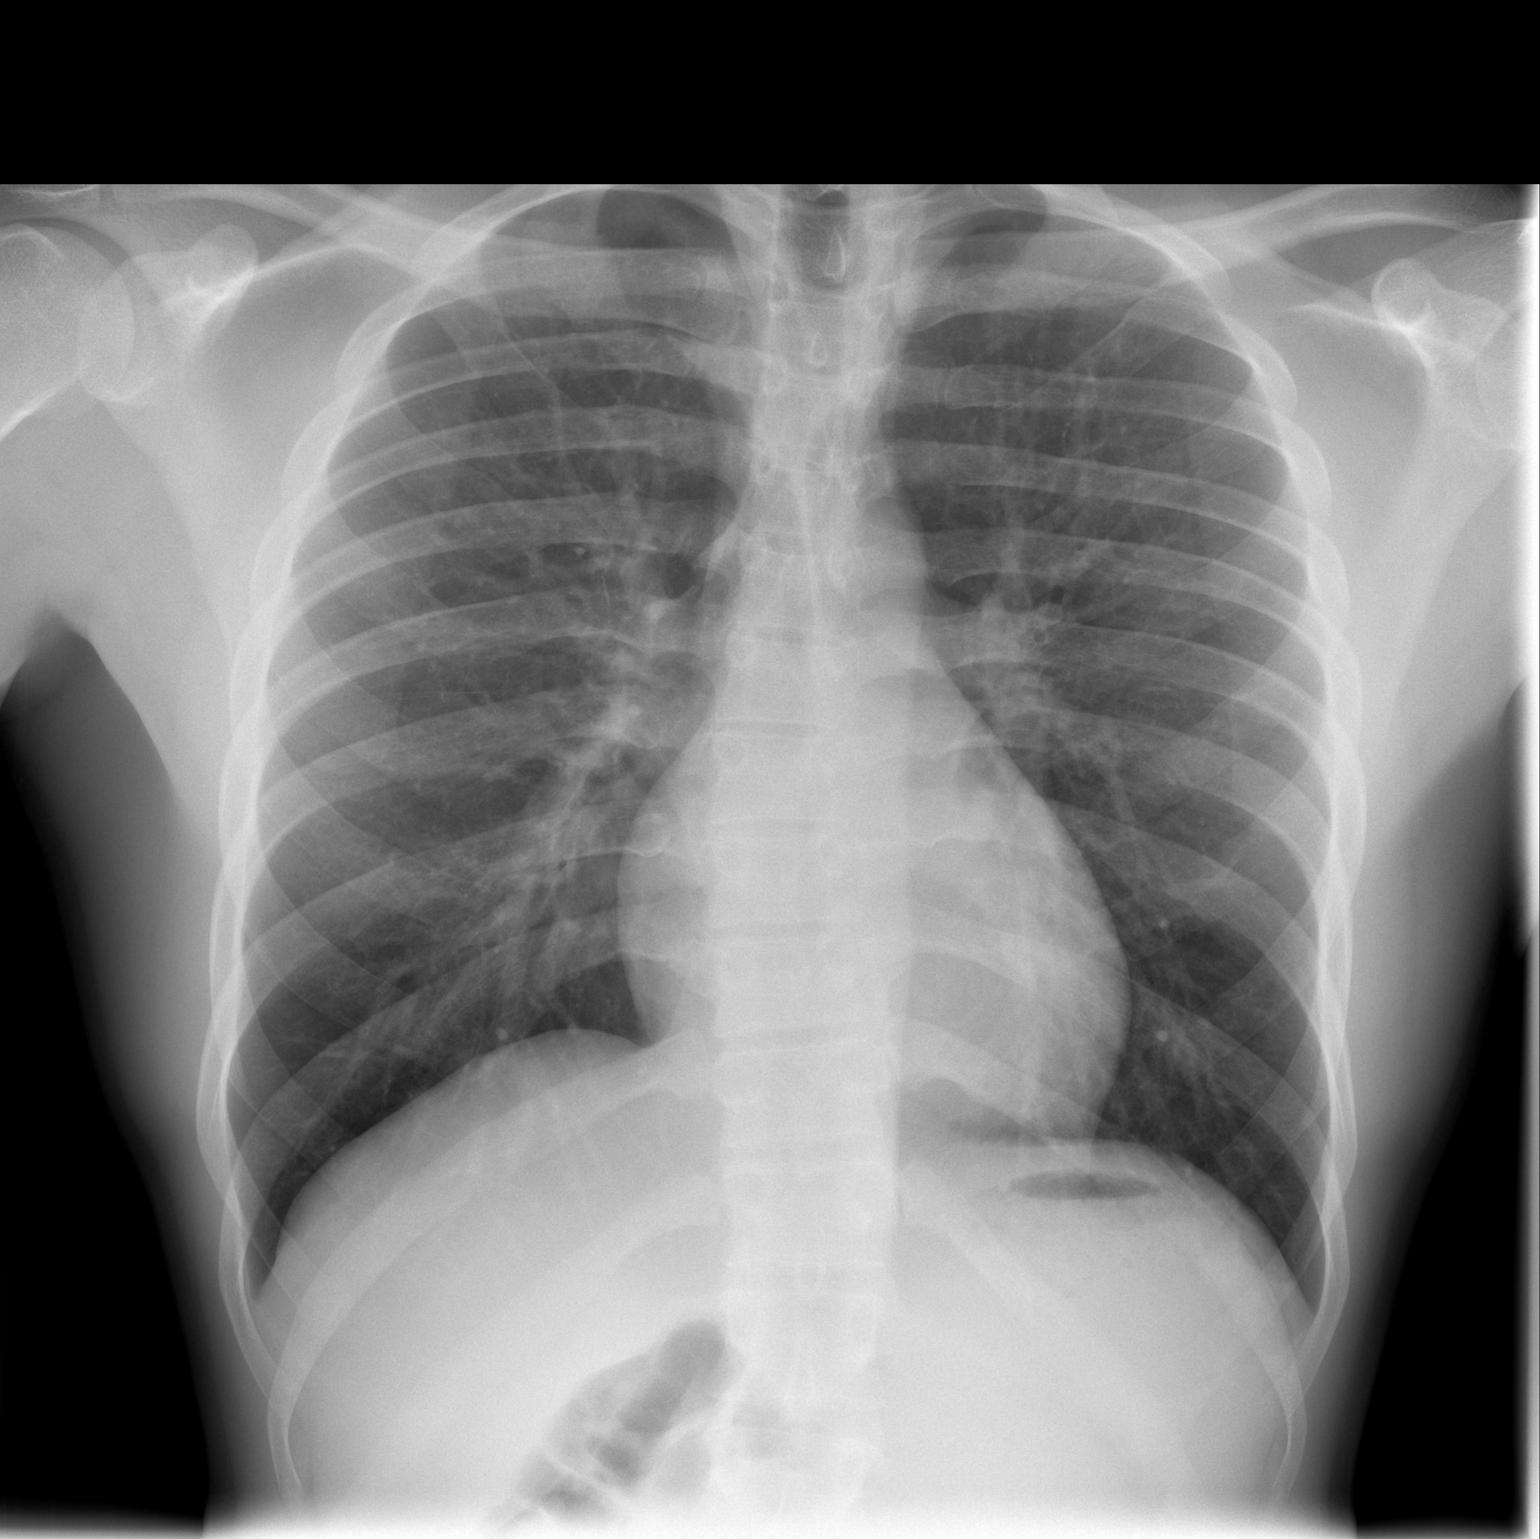

[w chest lat]
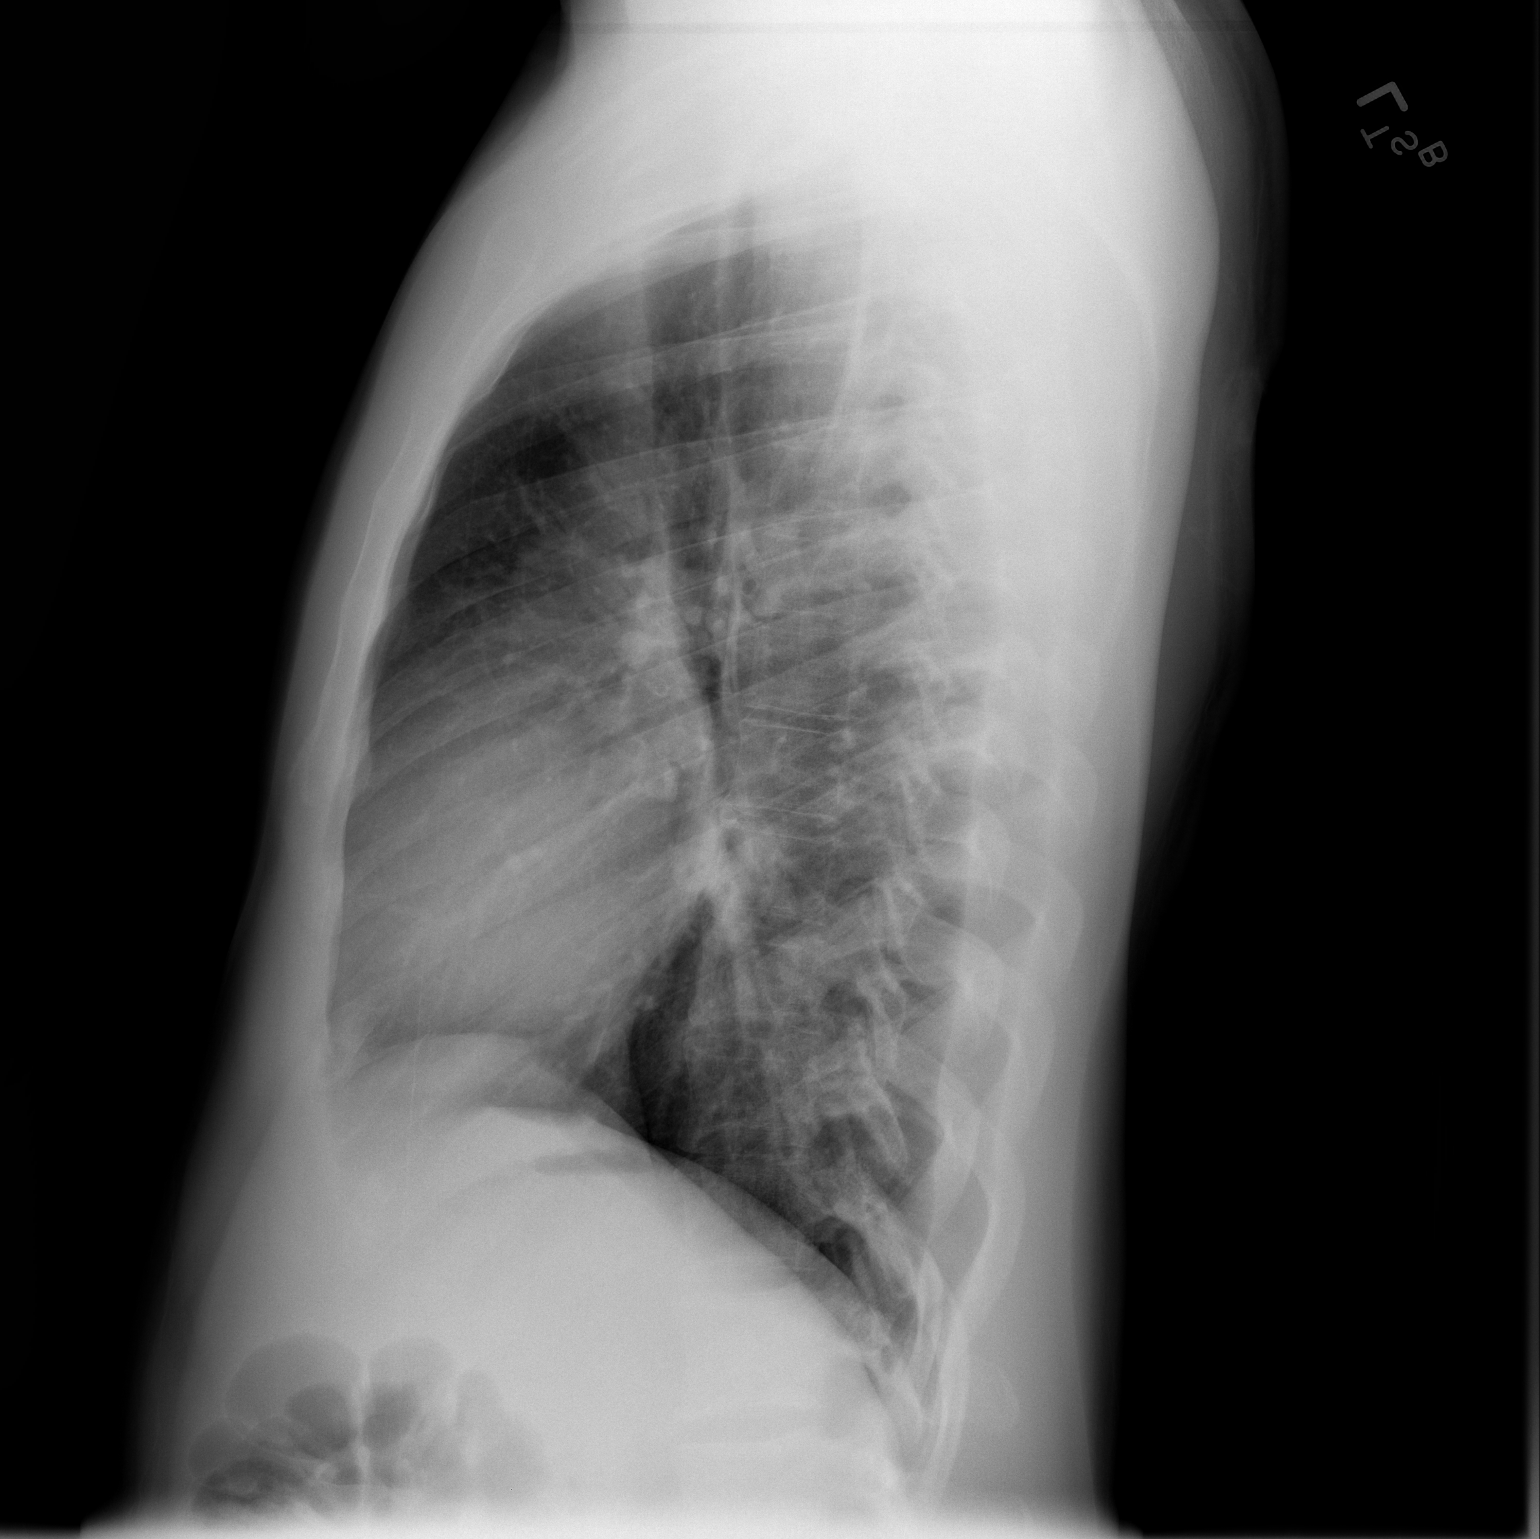

[2 of 2 positions shown; findings below may reference images not displayed]

FINDINGS: The lungs are well-aerated and clear.  There is no
evidence of focal opacification, pleural effusion or pneumothorax.

The heart is normal in size; the mediastinal contour is within
normal limits.  No acute osseous abnormalities are seen.
IMPRESSION: No acute cardiopulmonary process seen.

## 2013-06-07 ENCOUNTER — Encounter (HOSPITAL_BASED_OUTPATIENT_CLINIC_OR_DEPARTMENT_OTHER): Payer: Self-pay | Admitting: Emergency Medicine

## 2013-06-07 ENCOUNTER — Emergency Department (HOSPITAL_BASED_OUTPATIENT_CLINIC_OR_DEPARTMENT_OTHER)
Admission: EM | Admit: 2013-06-07 | Discharge: 2013-06-07 | Disposition: A | Discharge status: Home / Self Care | Payer: Self-pay | Attending: Emergency Medicine | Admitting: Emergency Medicine

## 2013-06-07 DIAGNOSIS — Y9289 Other specified places as the place of occurrence of the external cause: Secondary | ICD-10-CM | POA: Insufficient documentation

## 2013-06-07 DIAGNOSIS — X500XXA Overexertion from strenuous movement or load, initial encounter: Secondary | ICD-10-CM | POA: Insufficient documentation

## 2013-06-07 DIAGNOSIS — Y9339 Activity, other involving climbing, rappelling and jumping off: Secondary | ICD-10-CM | POA: Insufficient documentation

## 2013-06-07 DIAGNOSIS — S335XXA Sprain of ligaments of lumbar spine, initial encounter: Secondary | ICD-10-CM | POA: Insufficient documentation

## 2013-06-07 DIAGNOSIS — Z791 Long term (current) use of non-steroidal anti-inflammatories (NSAID): Secondary | ICD-10-CM | POA: Insufficient documentation

## 2013-06-07 DIAGNOSIS — F17200 Nicotine dependence, unspecified, uncomplicated: Secondary | ICD-10-CM | POA: Insufficient documentation

## 2013-06-07 DIAGNOSIS — J45909 Unspecified asthma, uncomplicated: Secondary | ICD-10-CM | POA: Insufficient documentation

## 2013-06-07 DIAGNOSIS — Z792 Long term (current) use of antibiotics: Secondary | ICD-10-CM | POA: Insufficient documentation

## 2013-06-07 DIAGNOSIS — S39012A Strain of muscle, fascia and tendon of lower back, initial encounter: Secondary | ICD-10-CM

## 2013-06-07 LAB — URINALYSIS, ROUTINE W REFLEX MICROSCOPIC
Glucose, UA: NEGATIVE mg/dL
Hgb urine dipstick: NEGATIVE
Ketones, ur: 15 mg/dL — AB
Leukocytes, UA: NEGATIVE
NITRITE: NEGATIVE
PH: 6 (ref 5.0–8.0)
Protein, ur: NEGATIVE mg/dL
SPECIFIC GRAVITY, URINE: 1.029 (ref 1.005–1.030)
Urobilinogen, UA: 0.2 mg/dL (ref 0.0–1.0)

## 2013-06-07 MED ORDER — METHOCARBAMOL 500 MG PO TABS: 500.0000 mg | ORAL_TABLET | Freq: Two times a day (BID) | ORAL | Status: AC

## 2013-06-07 MED ORDER — MELOXICAM 7.5 MG PO TABS: 7.5000 mg | ORAL_TABLET | Freq: Every day | ORAL | Status: AC

## 2013-06-07 NOTE — ED Notes (Signed)
Back pain x 3 months. Started after jumping over a creek. He has been wearing a back brace he bought.

## 2013-06-07 NOTE — Discharge Instructions (Signed)
Back Pain, Adult Low back pain is very common. About 1 in 5 people have back pain.The cause of low back pain is rarely dangerous. The pain often gets better over time.About half of people with a sudden onset of back pain feel better in just 2 weeks. About 8 in 10 people feel better by 6 weeks.  CAUSES Some common causes of back pain include:  Strain of the muscles or ligaments supporting the spine.  Wear and tear (degeneration) of the spinal discs.  Arthritis.  Direct injury to the back. DIAGNOSIS Most of the time, the direct cause of low back pain is not known.However, back pain can be treated effectively even when the exact cause of the pain is unknown.Answering your caregiver's questions about your overall health and symptoms is one of the most accurate ways to make sure the cause of your pain is not dangerous. If your caregiver needs more information, he or she may order lab work or imaging tests (X-rays or MRIs).However, even if imaging tests show changes in your back, this usually does not require surgery. HOME CARE INSTRUCTIONS For many people, back pain returns.Since low back pain is rarely dangerous, it is often a condition that people can learn to manageon their own.   Remain active. It is stressful on the back to sit or stand in one place. Do not sit, drive, or stand in one place for more than 30 minutes at a time. Take short walks on level surfaces as soon as pain allows.Try to increase the length of time you walk each day.  Do not stay in bed.Resting more than 1 or 2 days can delay your recovery.  Do not avoid exercise or work.Your body is made to move.It is not dangerous to be active, even though your back may hurt.Your back will likely heal faster if you return to being active before your pain is gone.  Pay attention to your body when you bend and lift. Many people have less discomfortwhen lifting if they bend their knees, keep the load close to their bodies,and  avoid twisting. Often, the most comfortable positions are those that put less stress on your recovering back.  Find a comfortable position to sleep. Use a firm mattress and lie on your side with your knees slightly bent. If you lie on your back, put a pillow under your knees.  Only take over-the-counter or prescription medicines as directed by your caregiver. Over-the-counter medicines to reduce pain and inflammation are often the most helpful.Your caregiver may prescribe muscle relaxant drugs.These medicines help dull your pain so you can more quickly return to your normal activities and healthy exercise.  Put ice on the injured area.  Put ice in a plastic bag.  Place a towel between your skin and the bag.  Leave the ice on for 15-20 minutes, 03-04 times a day for the first 2 to 3 days. After that, ice and heat may be alternated to reduce pain and spasms.  Ask your caregiver about trying back exercises and gentle massage. This may be of some benefit.  Avoid feeling anxious or stressed.Stress increases muscle tension and can worsen back pain.It is important to recognize when you are anxious or stressed and learn ways to manage it.Exercise is a great option. SEEK MEDICAL CARE IF:  You have pain that is not relieved with rest or medicine.  You have pain that does not improve in 1 week.  You have new symptoms.  You are generally not feeling well. SEEK   IMMEDIATE MEDICAL CARE IF:   You have pain that radiates from your back into your legs.  You develop new bowel or bladder control problems.  You have unusual weakness or numbness in your arms or legs.  You develop nausea or vomiting.  You develop abdominal pain.  You feel faint. Document Released: 02/24/2005 Document Revised: 08/26/2011 Document Reviewed: 07/15/2010 ExitCare Patient Information 2014 [employer].  

## 2013-06-07 NOTE — ED Provider Notes (Signed)
CSN: [id number]     Arrival date & time 06/07/13  1327 History   First MD Initiated Contact with Patient 06/07/13 1403     Chief Complaint  Patient presents with  . Back Pain     (Consider location/radiation/quality/duration/timing/severity/associated sxs/prior Treatment) Patient is a 28 y.o. male presenting with back pain. The history is provided by the patient. No language interpreter was used.  Back Pain Location:  Lumbar spine Quality:  Aching Radiates to:  Does not radiate Pain is:  Same all the time Timing:  Constant Progression:  Worsening Chronicity:  New Relieved by:  Nothing Worsened by:  Nothing tried Ineffective treatments:  None tried Associated symptoms: no abdominal pain   Risk factors: no lack of exercise     Past Medical History  Diagnosis Date  . Asthma    History reviewed. No pertinent past surgical history. No family history on file. History  Substance Use Topics  . Smoking status: Current Every Day Smoker -- 0.50 packs/day  . Smokeless tobacco: Not on file  . Alcohol Use: Yes    Review of Systems  Gastrointestinal: Negative for abdominal pain.  Musculoskeletal: Positive for back pain.  All other systems reviewed and are negative.      Allergies  Review of patient's allergies indicates no known allergies.  Home Medications   Current Outpatient Rx  Name  Route  Sig  Dispense  Refill  . amoxicillin (AMOXIL) 500 MG capsule   Oral   Take 1 capsule (500 mg total) by mouth 3 (three) times daily.   21 capsule   0   . ibuprofen (ADVIL,MOTRIN) 200 MG tablet   Oral   Take 400 mg by mouth every 6 (six) hours as needed. Patient used this medication for a tooth ache.         . ibuprofen (ADVIL,MOTRIN) 800 MG tablet   Oral   Take 1 tablet (800 mg total) by mouth 3 (three) times daily.   21 tablet   0    BP 150/98  Pulse 119  Temp(Src) 98.4 F (36.9 C) (Oral)  Resp 20  Ht 6' (1.829 m)  Wt 187 lb (84.823 kg)  BMI 25.36 kg/m2  SpO2  100% Physical Exam  Nursing note and vitals reviewed. Constitutional: He is oriented to person, place, and time. He appears well-developed and well-nourished.  HENT:  Head: Normocephalic and atraumatic.  Eyes: EOM are normal. Pupils are equal, round, and reactive to light.  Neck: Normal range of motion.  Cardiovascular: Normal rate and regular rhythm.   Pulmonary/Chest: Effort normal and breath sounds normal.  Abdominal: Soft. He exhibits no distension.  Musculoskeletal: He exhibits tenderness.  Tender ls spine diffusely,  From,  nv and ns intact  Neurological: He is alert and oriented to person, place, and time.  Skin: Skin is warm.  Psychiatric: He has a normal mood and affect.    ED Course  Procedures (including critical care time) Labs Review Labs Reviewed  URINALYSIS, ROUTINE W REFLEX MICROSCOPIC - Abnormal; Notable for the following:    Bilirubin Urine SMALL (*)    Ketones, ur 15 (*)    All other components within normal limits   Imaging Review No results found.   EKG Interpretation None      MDM   Final diagnoses:  None   meloxicam and robaxin.   Follow up with Dr. Pearletha Forgehudnall if pain persist   Edgar Hayden, [place] JerseyPA-C 06/07/13 1434

## 2013-06-08 NOTE — ED Provider Notes (Signed)
Medical screening examination/treatment/procedure(s) were performed by non-physician practitioner and as supervising physician I was immediately available for consultation/collaboration.   EKG Interpretation None        Junius ArgyleForrest S Simran Mannis, MD 06/08/13 1202
